# Patient Record
Sex: Male | Born: 2000 | Hispanic: Yes | Marital: Single | Smoking: Never smoker
Health system: Southern US, Community
[De-identification: ages and names within clinical notes are randomized; demographics above are authoritative.]

## PROBLEM LIST (undated history)

## (undated) DIAGNOSIS — J45909 Unspecified asthma, uncomplicated: Secondary | ICD-10-CM

---
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
## 2024-06-29 ENCOUNTER — Other Ambulatory Visit: Payer: Self-pay

## 2024-06-29 ENCOUNTER — Emergency Department: Payer: Self-pay

## 2024-06-29 ENCOUNTER — Encounter: Payer: Self-pay | Admitting: Emergency Medicine

## 2024-06-29 ENCOUNTER — Encounter (HOSPITAL_COMMUNITY): Payer: Self-pay

## 2024-06-29 ENCOUNTER — Observation Stay
Admission: EM | Admit: 2024-06-29 | Discharge: 2024-06-30 | Disposition: A | Discharge status: Home / Self Care | Payer: Self-pay | Attending: General Surgery | Admitting: General Surgery

## 2024-06-29 DIAGNOSIS — M79651 Pain in right thigh: Secondary | ICD-10-CM | POA: Insufficient documentation

## 2024-06-29 DIAGNOSIS — M545 Low back pain, unspecified: Secondary | ICD-10-CM | POA: Diagnosis present

## 2024-06-29 DIAGNOSIS — R188 Other ascites: Secondary | ICD-10-CM | POA: Diagnosis not present

## 2024-06-29 DIAGNOSIS — J45909 Unspecified asthma, uncomplicated: Secondary | ICD-10-CM | POA: Diagnosis not present

## 2024-06-29 LAB — CBC WITH DIFFERENTIAL/PLATELET
Abs Immature Granulocytes: 0.08 K/uL — ABNORMAL HIGH (ref 0.00–0.07)
Basophils Absolute: 0 K/uL (ref 0.0–0.1)
Basophils Relative: 0 %
Eosinophils Absolute: 0 K/uL (ref 0.0–0.5)
Eosinophils Relative: 0 %
HCT: 50.2 % (ref 39.0–52.0)
Hemoglobin: 16.4 g/dL (ref 13.0–17.0)
Immature Granulocytes: 1 %
Lymphocytes Relative: 9 %
Lymphs Abs: 1.2 K/uL (ref 0.7–4.0)
MCH: 29.5 pg (ref 26.0–34.0)
MCHC: 32.7 g/dL (ref 30.0–36.0)
MCV: 90.5 fL (ref 80.0–100.0)
Monocytes Absolute: 0.9 K/uL (ref 0.1–1.0)
Monocytes Relative: 7 %
Neutro Abs: 11.4 K/uL — ABNORMAL HIGH (ref 1.7–7.7)
Neutrophils Relative %: 83 %
Platelets: 242 K/uL (ref 150–400)
RBC: 5.55 MIL/uL (ref 4.22–5.81)
RDW: 13 % (ref 11.5–15.5)
WBC: 13.6 K/uL — ABNORMAL HIGH (ref 4.0–10.5)
nRBC: 0 % (ref 0.0–0.2)

## 2024-06-29 LAB — URINALYSIS, ROUTINE W REFLEX MICROSCOPIC
Bacteria, UA: NONE SEEN
Bilirubin Urine: NEGATIVE
Glucose, UA: NEGATIVE mg/dL
Ketones, ur: 20 mg/dL — AB
Leukocytes,Ua: NEGATIVE
Nitrite: NEGATIVE
Protein, ur: NEGATIVE mg/dL
Specific Gravity, Urine: >1.046 — ABNORMAL HIGH (ref 1.005–1.030)
Squamous Epithelial / HPF: 0 /HPF (ref 0–5)
pH: 6 (ref 5.0–8.0)

## 2024-06-29 LAB — COMPREHENSIVE METABOLIC PANEL WITH GFR
ALT: 49 U/L — ABNORMAL HIGH (ref 0–44)
AST: 87 U/L — ABNORMAL HIGH (ref 15–41)
Albumin: 4.5 g/dL (ref 3.5–5.0)
Alkaline Phosphatase: 52 U/L (ref 38–126)
Anion gap: 11 (ref 5–15)
BUN: 16 mg/dL (ref 6–20)
CO2: 27 mmol/L (ref 22–32)
Calcium: 9.4 mg/dL (ref 8.9–10.3)
Chloride: 103 mmol/L (ref 98–111)
Creatinine, Ser: 0.92 mg/dL (ref 0.61–1.24)
GFR, Estimated: >60 mL/min (ref >60)
Glucose, Bld: 84 mg/dL (ref 70–99)
Potassium: 3.7 mmol/L (ref 3.5–5.1)
Sodium: 141 mmol/L (ref 135–145)
Total Bilirubin: 0.6 mg/dL (ref 0.0–1.2)
Total Protein: 7.6 g/dL (ref 6.5–8.1)

## 2024-06-29 LAB — LIPASE, BLOOD: Lipase: 25 U/L (ref 11–51)

## 2024-06-29 MED ORDER — OXYCODONE HCL 5 MG PO TABS: 5.0000 mg | ORAL_TABLET | ORAL | Status: DC | PRN

## 2024-06-29 MED ORDER — ACETAMINOPHEN 500 MG PO TABS
1000.0000 mg | ORAL_TABLET | Freq: Once | ORAL | Status: AC
  Administered 2024-06-29: 1000 mg via ORAL
  Filled 2024-06-29: qty 2

## 2024-06-29 MED ORDER — ONDANSETRON HCL 4 MG/2ML IJ SOLN: 4.0000 mg | Freq: Four times a day (QID) | INTRAMUSCULAR | Status: DC | PRN

## 2024-06-29 MED ORDER — HYDROMORPHONE HCL 1 MG/ML IJ SOLN
0.5000 mg | INTRAMUSCULAR | Status: DC | PRN
  Administered 2024-06-29: 0.5 mg via INTRAVENOUS
  Filled 2024-06-29: qty 0.5

## 2024-06-29 MED ORDER — LIDOCAINE 5 % EX PTCH
1.0000 | MEDICATED_PATCH | CUTANEOUS | Status: DC
  Administered 2024-06-29: 1 via TRANSDERMAL
  Filled 2024-06-29: qty 1

## 2024-06-29 MED ORDER — TETANUS-DIPHTH-ACELL PERTUSSIS 5-2.5-18.5 LF-MCG/0.5 IM SUSY
0.5000 mL | PREFILLED_SYRINGE | Freq: Once | INTRAMUSCULAR | Status: AC
  Administered 2024-06-29: 0.5 mL via INTRAMUSCULAR
  Filled 2024-06-29: qty 0.5

## 2024-06-29 MED ORDER — ALBUTEROL SULFATE (2.5 MG/3ML) 0.083% IN NEBU: 3.0000 mL | INHALATION_SOLUTION | Freq: Four times a day (QID) | RESPIRATORY_TRACT | Status: DC | PRN

## 2024-06-29 MED ORDER — IOHEXOL 300 MG/ML  SOLN
100.0000 mL | Freq: Once | INTRAMUSCULAR | Status: AC | PRN
  Administered 2024-06-29: 100 mL via INTRAVENOUS

## 2024-06-29 MED ORDER — ACETAMINOPHEN 325 MG PO TABS: 650.0000 mg | ORAL_TABLET | Freq: Four times a day (QID) | ORAL | Status: DC | PRN

## 2024-06-29 MED ORDER — LIDOCAINE 5 % EX PTCH: 1.0000 | MEDICATED_PATCH | CUTANEOUS | 0 refills | Status: AC
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
## 2024-06-29 NOTE — Discharge Instructions (Signed)
   Your liver tests are slightly elevated and should follow this up with your primary care doctor.

## 2024-06-30 ENCOUNTER — Encounter: Payer: Self-pay | Admitting: General Surgery

## 2024-06-30 LAB — CBC
HCT: 47 % (ref 39.0–52.0)
Hemoglobin: 16.1 g/dL (ref 13.0–17.0)
MCH: 30 pg (ref 26.0–34.0)
MCHC: 34.3 g/dL (ref 30.0–36.0)
MCV: 87.7 fL (ref 80.0–100.0)
Platelets: 240 K/uL (ref 150–400)
RBC: 5.36 MIL/uL (ref 4.22–5.81)
RDW: 13 % (ref 11.5–15.5)
WBC: 9.6 K/uL (ref 4.0–10.5)
nRBC: 0 % (ref 0.0–0.2)

## 2024-06-30 LAB — HIV ANTIBODY (ROUTINE TESTING W REFLEX): HIV Screen 4th Generation wRfx: NONREACTIVE

## 2024-06-30 MED ORDER — ACETAMINOPHEN 325 MG PO TABS: 650.0000 mg | ORAL_TABLET | Freq: Four times a day (QID) | ORAL | Status: AC | PRN

## 2024-06-30 NOTE — Progress Notes (Signed)
 Discharge instructions reviewed with patient and patient's mother, verbalized understanding. Patient inquired about work note to which Dr. Marinda informed this nurse a physical copy would need to be picked up from his office by 12 noon today. Patient made aware and verbalized understanding. IV removed and patient transports to private vehicle via wheelchair.
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
## 2024-06-30 NOTE — Plan of Care (Signed)
